# Patient Record
Sex: Male | Born: 2005 | Race: White | Hispanic: No | Marital: Single | Smoking: Never smoker
Health system: Southern US, Community
[De-identification: ages and names within clinical notes are randomized; demographics above are authoritative.]

## PROBLEM LIST (undated history)

## (undated) DIAGNOSIS — S42009A Fracture of unspecified part of unspecified clavicle, initial encounter for closed fracture: Secondary | ICD-10-CM

## (undated) DIAGNOSIS — T7840XA Allergy, unspecified, initial encounter: Secondary | ICD-10-CM

## (undated) DIAGNOSIS — S8000XA Contusion of unspecified knee, initial encounter: Secondary | ICD-10-CM

## (undated) DIAGNOSIS — J45909 Unspecified asthma, uncomplicated: Secondary | ICD-10-CM

## (undated) DIAGNOSIS — E860 Dehydration: Secondary | ICD-10-CM

## (undated) DIAGNOSIS — R159 Full incontinence of feces: Secondary | ICD-10-CM

## (undated) DIAGNOSIS — R5600 Simple febrile convulsions: Secondary | ICD-10-CM

## (undated) DIAGNOSIS — Z9109 Other allergy status, other than to drugs and biological substances: Secondary | ICD-10-CM

## (undated) HISTORY — DX: Dehydration: E86.0

## (undated) HISTORY — PX: TOOTH EXTRACTION: SUR596

## (undated) HISTORY — DX: Allergy, unspecified, initial encounter: T78.40XA

## (undated) HISTORY — DX: Fracture of unspecified part of unspecified clavicle, initial encounter for closed fracture: S42.009A

## (undated) HISTORY — DX: Unspecified asthma, uncomplicated: J45.909

## (undated) HISTORY — DX: Full incontinence of feces: R15.9

## (undated) HISTORY — DX: Contusion of unspecified knee, initial encounter: S80.00XA

---
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
## 2023-01-07 DIAGNOSIS — M25531 Pain in right wrist: Secondary | ICD-10-CM | POA: Diagnosis not present

## 2023-01-14 DIAGNOSIS — F419 Anxiety disorder, unspecified: Secondary | ICD-10-CM | POA: Diagnosis not present

## 2023-01-14 DIAGNOSIS — F900 Attention-deficit hyperactivity disorder, predominantly inattentive type: Secondary | ICD-10-CM | POA: Diagnosis not present

## 2023-01-14 DIAGNOSIS — F338 Other recurrent depressive disorders: Secondary | ICD-10-CM | POA: Diagnosis not present

## 2023-01-21 DIAGNOSIS — F411 Generalized anxiety disorder: Secondary | ICD-10-CM | POA: Diagnosis not present

## 2023-01-21 DIAGNOSIS — F902 Attention-deficit hyperactivity disorder, combined type: Secondary | ICD-10-CM | POA: Diagnosis not present

## 2023-02-07 DIAGNOSIS — M25531 Pain in right wrist: Secondary | ICD-10-CM | POA: Diagnosis not present

## 2023-03-06 DIAGNOSIS — F902 Attention-deficit hyperactivity disorder, combined type: Secondary | ICD-10-CM | POA: Diagnosis not present

## 2023-03-06 DIAGNOSIS — F411 Generalized anxiety disorder: Secondary | ICD-10-CM | POA: Diagnosis not present

## 2023-04-04 DIAGNOSIS — F902 Attention-deficit hyperactivity disorder, combined type: Secondary | ICD-10-CM | POA: Diagnosis not present

## 2023-04-04 DIAGNOSIS — F411 Generalized anxiety disorder: Secondary | ICD-10-CM | POA: Diagnosis not present

## 2023-04-11 ENCOUNTER — Other Ambulatory Visit: Payer: Self-pay | Admitting: Pediatrics

## 2023-04-12 NOTE — Progress Notes (Signed)
 NEUROLOGY FOLLOW UP OFFICE NOTE  Edwin Mcdonald [id number]  Assessment/Plan:   Migraine without aura, without status migrainosus, not intractable - improved  Migraine prevention:  Topiramate 25mg  at bedtime.   Migraine rescue:  Rizatriptan 10mg  Limit use of pain relievers to no more than 2 days out of week to prevent risk of rebound or medication-overuse headache. Keep headache diary Follow up 6 months.    Subjective:  Edwin Mcdonald is an 18 year old male with asthma and history of febrile seizures who follows up for migraines.  He is accompanied by his mother.  UPDATE: Intensity:  3/10 Duration:  20-30 minutes with rizatriptan. Frequency:  Last migraine was 3 weeks ago.   Current NSAIDS/analgesics:  none Current triptans:  rizatriptan 10mg  Current ergotamine:  none Current anti-emetic:  none Current muscle relaxants:  none Current Antihypertensive medications:  none Current Antidepressant medications:  none Current Anticonvulsant medications:  topiramate 25mg  at bedtime Current anti-CGRP:  none Current Vitamins/Herbal/Supplements:  none Current Antihistamines/Decongestants:  none Other therapy:  none  Tea and soda daily.  No coffee 16 oz water daily.  Skips breakfast and sometimes lunch No  Sleep:  5-7 hours wakes up now and then.  Rested in morning but tired midday Depression/anxiety  HISTORY: Reports headaches since he sustained a concussion in March 2023 after slipping in the bathtub and striking the back of his head.  CT head at that time was unremarkable.  He describes diffuse pounding headache sometimes with phonophobia but no nausea, vomiting, photophobia or visual disturbance.  Significantly aggravated by routine activity.  Treats with Tylenol.  Lasts all day and occurs 1 to 2 times a week.  No known triggers.  Headaches are not positional.  His younger sister had headaches and was subsequently diagnosed with idiopathic intracranial hypertension.  Concerned that  this may be the same thing.  He saw the ophthalmologist in April 2024.  OCT exam revealed RNFL thickening (stable compared to prior studies) and no frank disc edema seen.  HVF were full and without enlarged blind spot.  Denies visual obscurations or pulsatile tinnitus.    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 30 tablet 2   cetirizine (ZYRTEC) 10 MG tablet TAKE 1 TABLET BY MOUTH EVERY DAY 90 tablet 4   clobetasol cream (TEMOVATE) 0.05 % Apply 1 Application topically 2 (two) times daily. Apply topically to the affected areas on the body for up to two weeks. Repeat as needed for flares 30 g 0   Crisaborole (EUCRISA) 2 % OINT Apply 1 Application topically 2 (two) times daily as needed. 100 g 3   EUCRISA 2 % OINT APPLY 1 APPLICATION TOPICALLY TWICE A DAY AS NEEDED 60 g 3   mometasone (ELOCON) 0.1 % ointment Apply topically daily as needed (rash). For  thick, stubborn areas. Do not use on the face, neck, armpits or groin area. Do not use more than 2 weeks in a row. 45 g 1   omeprazole (PRILOSEC) 20 MG capsule TAKE 1 CAPSULE BY MOUTH EVERY DAY 30 capsule 12   pimecrolimus (ELIDEL) 1 % cream Apply topically 2 (two) times daily. 30 g 0   QELBREE 150 MG 24 hr capsule Take 300 mg by mouth every morning.     rizatriptan (MAXALT) 10 MG tablet  Take 1 tablet (10 mg total) by mouth as needed for migraine. May repeat in 2 hours if needed.  Maximum 2 tablets in 24 hours. 10 tablet 5   topiramate (TOPAMAX) 25 MG tablet Take 1 tablet (25 mg total) by mouth at bedtime. 30 tablet 5   triamcinolone cream (KENALOG) 0.1 % Apply 1 Application topically 2 (two) times daily. 453.6 g 0   VENTOLIN HFA 108 (90 Base) MCG/ACT inhaler TAKE 2 PUFFS BY MOUTH EVERY 6 HOURS AS NEEDED FOR WHEEZE OR SHORTNESS OF BREATH 18 each 2   No current facility-administered medications on file prior to visit.    ALLERGIES: No Known Allergies  FAMILY HISTORY: Family History  Problem Relation Age of Onset   Migraines Mother    Seizures Mother    Asthma Mother    Allergic rhinitis Mother    Parkinsonism Father    Asthma Father    Seizures Sister    Seizures Maternal Grandmother    Diabetes Maternal Grandmother    Hypertension Maternal Grandmother    Hyperlipidemia Maternal Grandmother    Depression Maternal Grandmother    Diabetes Paternal Grandmother    Hypertension Paternal Grandmother    Cancer Paternal Grandmother        ovarian   Depression Paternal Grandmother    Diabetes Paternal Grandfather    Hypertension Paternal Grandfather    Heart disease Paternal Grandfather    Alcohol abuse Neg Hx    Arthritis Neg Hx    Birth defects Neg Hx    COPD Neg Hx    Drug abuse Neg Hx    Early death Neg Hx    Hearing loss Neg Hx    Kidney disease Neg Hx    Learning disabilities Neg Hx    Mental illness Neg Hx    Mental retardation Neg Hx    Miscarriages / Stillbirths Neg Hx    Stroke Neg Hx    Vision loss Neg Hx    Varicose Veins Neg Hx       Objective:  Blood pressure 136/84, resp. rate 18, height 5' 8.5" (1.74 m), weight (!) 312 lb (141.5 kg), SpO2 98%. General: No acute distress.  Patient appears well-groomed.   Head:  Normocephalic/atraumatic Eyes:  Fundi examined but not visualized Neck: supple, no paraspinal tenderness, full range of  motion Heart:  Regular rate and rhythm Lungs:  Clear to auscultation bilaterally Back: No paraspinal tenderness Neurological Exam: alert and oriented.  Speech fluent and not dysarthric, language intact.  CN II-XII intact. Bulk and tone normal, muscle strength 5/5 throughout.  Sensation to light touch intact.  Deep tendon reflexes 2+ throughout, toes downgoing.  Finger to nose testing intact.  Gait normal, Romberg negative.   Shon Millet, DO  CC: Jerilynn Mages, MD

## 2023-04-15 ENCOUNTER — Ambulatory Visit (INDEPENDENT_AMBULATORY_CARE_PROVIDER_SITE_OTHER): Payer: Self-pay | Admitting: Neurology

## 2023-04-15 ENCOUNTER — Encounter: Payer: Self-pay | Admitting: Neurology

## 2023-04-15 VITALS — BP 136/84 | Resp 18 | Ht 68.5 in | Wt 312.0 lb

## 2023-04-15 DIAGNOSIS — G43009 Migraine without aura, not intractable, without status migrainosus: Secondary | ICD-10-CM

## 2023-04-15 MED ORDER — TOPIRAMATE 25 MG PO TABS: 25.0000 mg | ORAL_TABLET | Freq: Every day | ORAL | 5 refills | Status: AC

## 2023-04-15 MED ORDER — RIZATRIPTAN BENZOATE 10 MG PO TABS: 10.0000 mg | ORAL_TABLET | ORAL | 5 refills | Status: AC | PRN

## 2023-04-15 NOTE — Patient Instructions (Signed)
 Continue topiramate 25mg  at bedtime Rizatriptan as needed/directed

## 2023-04-29 DIAGNOSIS — F419 Anxiety disorder, unspecified: Secondary | ICD-10-CM | POA: Diagnosis not present

## 2023-04-29 DIAGNOSIS — F900 Attention-deficit hyperactivity disorder, predominantly inattentive type: Secondary | ICD-10-CM | POA: Diagnosis not present

## 2023-04-29 DIAGNOSIS — F338 Other recurrent depressive disorders: Secondary | ICD-10-CM | POA: Diagnosis not present

## 2023-05-01 ENCOUNTER — Other Ambulatory Visit: Payer: Self-pay | Admitting: Dermatology

## 2023-05-14 DIAGNOSIS — F902 Attention-deficit hyperactivity disorder, combined type: Secondary | ICD-10-CM | POA: Diagnosis not present

## 2023-05-14 DIAGNOSIS — F411 Generalized anxiety disorder: Secondary | ICD-10-CM | POA: Diagnosis not present

## 2023-06-10 DIAGNOSIS — F902 Attention-deficit hyperactivity disorder, combined type: Secondary | ICD-10-CM | POA: Diagnosis not present

## 2023-06-10 DIAGNOSIS — F411 Generalized anxiety disorder: Secondary | ICD-10-CM | POA: Diagnosis not present

## 2023-07-25 DIAGNOSIS — F902 Attention-deficit hyperactivity disorder, combined type: Secondary | ICD-10-CM | POA: Diagnosis not present

## 2023-07-25 DIAGNOSIS — F411 Generalized anxiety disorder: Secondary | ICD-10-CM | POA: Diagnosis not present

## 2023-07-29 DIAGNOSIS — F900 Attention-deficit hyperactivity disorder, predominantly inattentive type: Secondary | ICD-10-CM | POA: Diagnosis not present

## 2023-07-29 DIAGNOSIS — F338 Other recurrent depressive disorders: Secondary | ICD-10-CM | POA: Diagnosis not present

## 2023-07-29 DIAGNOSIS — F419 Anxiety disorder, unspecified: Secondary | ICD-10-CM | POA: Diagnosis not present

## 2023-08-26 DIAGNOSIS — F411 Generalized anxiety disorder: Secondary | ICD-10-CM | POA: Diagnosis not present

## 2023-08-26 DIAGNOSIS — F902 Attention-deficit hyperactivity disorder, combined type: Secondary | ICD-10-CM | POA: Diagnosis not present

## 2023-09-23 DIAGNOSIS — F411 Generalized anxiety disorder: Secondary | ICD-10-CM | POA: Diagnosis not present

## 2023-09-23 DIAGNOSIS — F902 Attention-deficit hyperactivity disorder, combined type: Secondary | ICD-10-CM | POA: Diagnosis not present

## 2023-10-10 DIAGNOSIS — F411 Generalized anxiety disorder: Secondary | ICD-10-CM | POA: Diagnosis not present

## 2023-10-10 DIAGNOSIS — F902 Attention-deficit hyperactivity disorder, combined type: Secondary | ICD-10-CM | POA: Diagnosis not present

## 2023-10-11 NOTE — Progress Notes (Deleted)
 NEUROLOGY FOLLOW UP OFFICE NOTE  Edwin Mcdonald [id number]  Assessment/Plan:   Migraine without aura, without status migrainosus, not intractable - improved  Migraine prevention:  Topiramate  25mg  at bedtime.  *** Migraine rescue:  Rizatriptan  10mg  *** Limit use of pain relievers to no more than 9 days out of the month to prevent risk of rebound or medication-overuse headache. Keep headache diary Follow up ***    Subjective:  Edwin Mcdonald is an 18 year old male with asthma and history of febrile seizures who follows up for migraines.  He is accompanied by his mother.  UPDATE: Intensity:  3/10 Duration:  20-30 minutes with rizatriptan . Frequency:  Last migraine was 3 weeks ago.   Current NSAIDS/analgesics:  none Current triptans:  rizatriptan  10mg  Current ergotamine:  none Current anti-emetic:  none Current muscle relaxants:  none Current Antihypertensive medications:  none Current Antidepressant medications:  none Current Anticonvulsant medications:  topiramate  25mg  at bedtime Current anti-CGRP:  none Current Vitamins/Herbal/Supplements:  none Current Antihistamines/Decongestants:  none Other therapy:  none  Tea and soda daily.  No coffee 16 oz water daily.  Skips breakfast and sometimes lunch No  Sleep:  5-7 hours wakes up now and then.  Rested in morning but tired midday Depression/anxiety  HISTORY: Reports headaches since he sustained a concussion in March 2023 after slipping in the bathtub and striking the back of his head.  CT head at that time was unremarkable.  He describes diffuse pounding headache sometimes with phonophobia but no nausea, vomiting, photophobia or visual disturbance.  Significantly aggravated by routine activity.  Treats with Tylenol .  Lasts all day and occurs 1 to 2 times a week.  No known triggers.  Headaches are not positional.  His younger sister had headaches and was subsequently diagnosed with idiopathic intracranial hypertension.  Concerned  that this may be the same thing.  He saw the ophthalmologist in April 2024.  OCT exam revealed RNFL thickening (stable compared to prior studies) and no frank disc edema seen.  HVF were full and without enlarged blind spot.  Denies visual obscurations or pulsatile tinnitus.    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 30 tablet 2   cetirizine  (ZYRTEC ) 10 MG tablet TAKE 1 TABLET BY MOUTH EVERY DAY 90 tablet 4   clobetasol  cream (TEMOVATE ) 0.05 % Apply 1 Application topically 2 (two) times daily. Apply topically to the affected areas on the body for up to two weeks. Repeat as needed for flares 30 g 0   Crisaborole  (EUCRISA ) 2 % OINT Apply 1 Application topically 2 (two) times daily as needed. 100 g 3   EUCRISA  2 % OINT APPLY 1 APPLICATION TOPICALLY TWICE A DAY AS NEEDED 60 g 3   mometasone  (ELOCON ) 0.1 % ointment Apply topically daily as needed (rash).  For thick, stubborn areas. Do not use on the face, neck, armpits or groin area. Do not use more than 2 weeks in a row. 45 g 1   omeprazole  (PRILOSEC) 20 MG capsule TAKE 1 CAPSULE BY MOUTH EVERY DAY 30 capsule 12   pimecrolimus  (ELIDEL ) 1 % cream Apply topically 2 (two) times daily. (Patient not taking: Reported on 04/15/2023) 30 g 0   QELBREE 150 MG 24 hr capsule Take 300 mg by mouth  every morning.     rizatriptan  (MAXALT ) 10 MG tablet Take 1 tablet (10 mg total) by mouth as needed for migraine. May repeat in 2 hours if needed.  Maximum 2 tablets in 24 hours. 10 tablet 5   topiramate  (TOPAMAX ) 25 MG tablet Take 1 tablet (25 mg total) by mouth at bedtime. 30 tablet 5   triamcinolone  cream (KENALOG ) 0.1 % Apply 1 Application topically 2 (two) times daily. 453.6 g 0   VENTOLIN  HFA 108 (90 Base) MCG/ACT inhaler TAKE 2 PUFFS BY MOUTH EVERY 6 HOURS AS NEEDED FOR WHEEZE OR SHORTNESS OF BREATH 18 each 2   No current facility-administered medications on file prior to visit.    ALLERGIES: No Known Allergies  FAMILY HISTORY: Family History  Problem Relation Age of Onset   Migraines Mother    Seizures Mother    Asthma Mother    Allergic rhinitis Mother    Parkinsonism Father    Asthma Father    Seizures Sister    Seizures Maternal Grandmother    Diabetes Maternal Grandmother    Hypertension Maternal Grandmother    Hyperlipidemia Maternal Grandmother    Depression Maternal Grandmother    Diabetes Paternal Grandmother    Hypertension Paternal Grandmother    Cancer Paternal Grandmother        ovarian   Depression Paternal Grandmother    Diabetes Paternal Grandfather    Hypertension Paternal Grandfather    Heart disease Paternal Grandfather    Alcohol abuse Neg Hx    Arthritis Neg Hx    Birth defects Neg Hx    COPD Neg Hx    Drug abuse Neg Hx    Early death Neg Hx    Hearing loss Neg Hx    Kidney disease Neg Hx    Learning disabilities Neg Hx    Mental illness Neg Hx    Mental retardation Neg Hx    Miscarriages / Stillbirths Neg Hx    Stroke Neg Hx    Vision loss Neg Hx    Varicose Veins Neg Hx       Objective:  *** General: No acute distress.  Patient appears ***-groomed.   Head:  Normocephalic/atraumatic Eyes:  Fundi examined but not visualized Neck: supple, no paraspinal tenderness, full range of motion Heart:  Regular rate and rhythm Neurological  Exam: alert and oriented.  Speech fluent and not dysarthric, language intact.  CN II-XII intact. Bulk and tone normal, muscle strength 5/5 throughout.  Sensation to light touch intact.  Deep tendon reflexes 2+ throughout, toes downgoing.  Finger to nose testing intact.  Gait normal, Romberg negative.   Juliene Dunnings, DO  CC: ***

## 2023-10-14 ENCOUNTER — Ambulatory Visit: Admitting: Neurology

## 2023-11-19 DIAGNOSIS — F902 Attention-deficit hyperactivity disorder, combined type: Secondary | ICD-10-CM | POA: Diagnosis not present

## 2023-11-19 DIAGNOSIS — F411 Generalized anxiety disorder: Secondary | ICD-10-CM | POA: Diagnosis not present

## 2023-12-19 DIAGNOSIS — F411 Generalized anxiety disorder: Secondary | ICD-10-CM | POA: Diagnosis not present

## 2023-12-19 DIAGNOSIS — F902 Attention-deficit hyperactivity disorder, combined type: Secondary | ICD-10-CM | POA: Diagnosis not present
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
## 2024-02-03 DIAGNOSIS — F900 Attention-deficit hyperactivity disorder, predominantly inattentive type: Secondary | ICD-10-CM | POA: Diagnosis not present

## 2024-02-03 DIAGNOSIS — F419 Anxiety disorder, unspecified: Secondary | ICD-10-CM | POA: Diagnosis not present

## 2024-02-03 DIAGNOSIS — F338 Other recurrent depressive disorders: Secondary | ICD-10-CM | POA: Diagnosis not present
